# Patient Record
Sex: Male | Born: 1965 | Race: White | Hispanic: No | Marital: Married | Smoking: Never smoker
Health system: Southern US, Community
[De-identification: ages and names within clinical notes are randomized; demographics above are authoritative.]

---

## 2003-08-05 ENCOUNTER — Inpatient Hospital Stay (HOSPITAL_COMMUNITY): Admission: EM | Admit: 2003-08-05 | Discharge: 2003-08-09 | Payer: Self-pay | Admitting: Emergency Medicine

## 2006-03-27 IMAGING — CT CT PELVIS W/ CM
1 of 3 series · 14 of 32 positions shown, 19 images · IV contrast (CONTRAST)
Comparison: none

CLINICAL DATA: Left lower abdominal pain. 
 CT ABDOMEN WITH CONTRAST AND PELVIS WITH CONTRAST 
 Contrast:  100 cc Omnipaque 300 IV as well as oral contrast. 
 CT ABDOMEN WITH CONTRAST 
 Visualized lung bases show bibasilar atelectasis.  The enhanced appearance of the liver, spleen, gallbladder, pancreas, adrenal glands and kidneys are within normal limits.  Abdominal bowel loops show no obstruction, inflammatory process, or focal thickening.  No abscess or free fluid is seen in the abdomen. 
 IMPRESSION
 Normal CT of the abdomen with contrast. 
 CT PELVIS WITH CONTRAST
 There is severe diverticular disease of the sigmoid colon with a diffuse segment of inflamed and thickened colon present including small adjacent fluid collections likely representing small pericolonic abscesses.  No overt perforation.  Findings are consistent with diverticulitis. 
 Severe diverticulitis involving a segment of the sigmoid colon with small developing fluid collections likely representing multiple small pericolonic abscesses.  No evidence of perforation or associated bowel obstruction.

[Series 8151: — · axial · 0.75mm/px · z∈[+1504,+1954]mm · 14 of 102 slices shown, 19 images]
[im 6/102  soft-tissue]
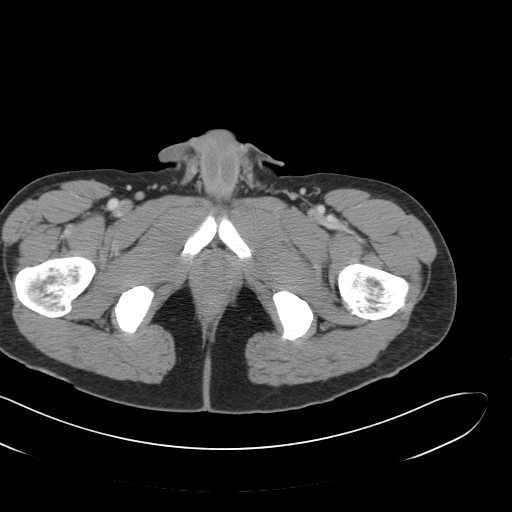
[im 6/102  bone]
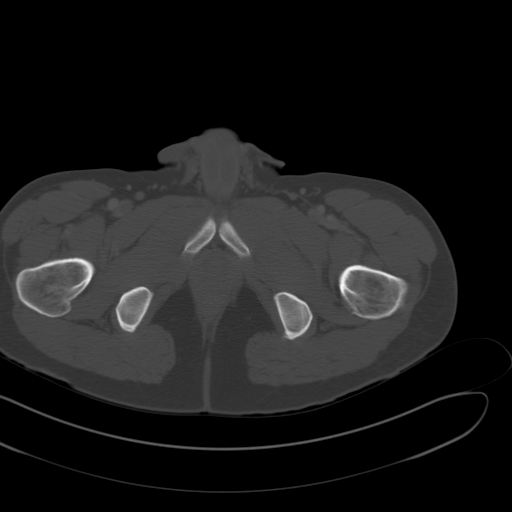
[im 12/102  soft-tissue]
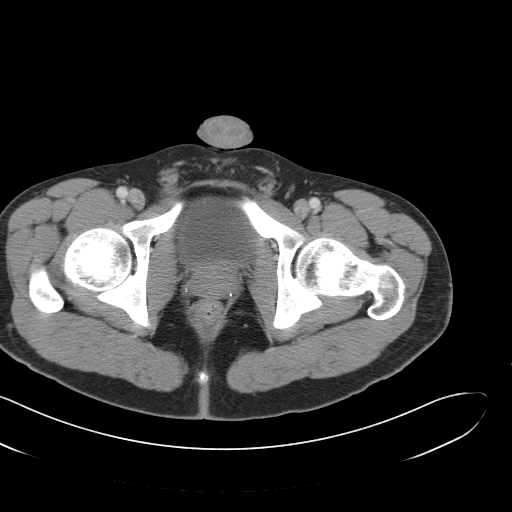
[im 23/102  soft-tissue]
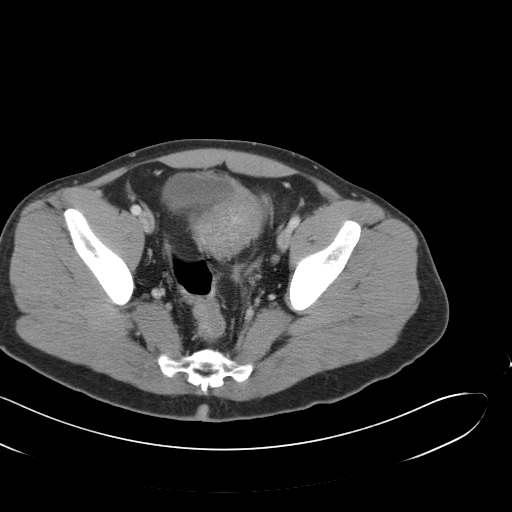
[im 29/102  soft-tissue]
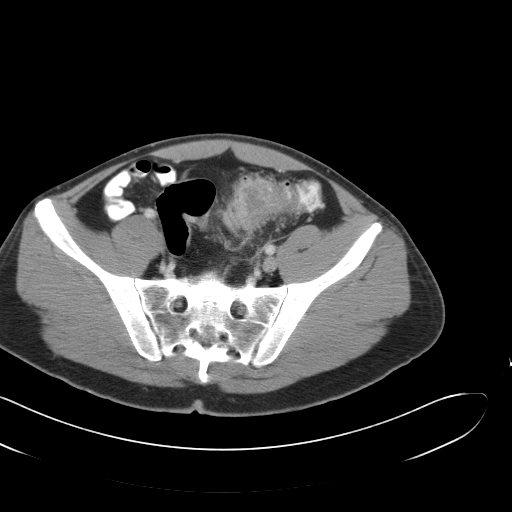
[im 34/102  soft-tissue]
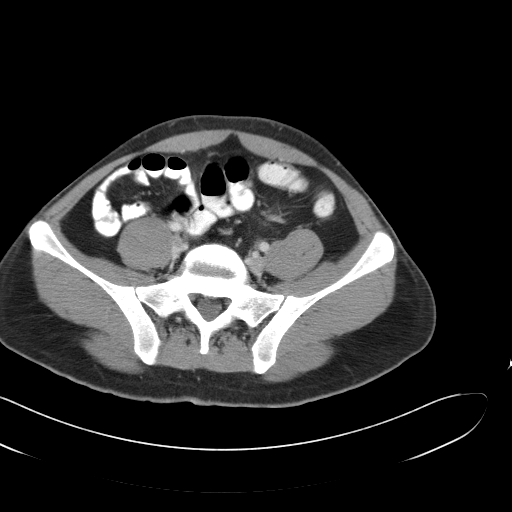
[im 45/102  soft-tissue]
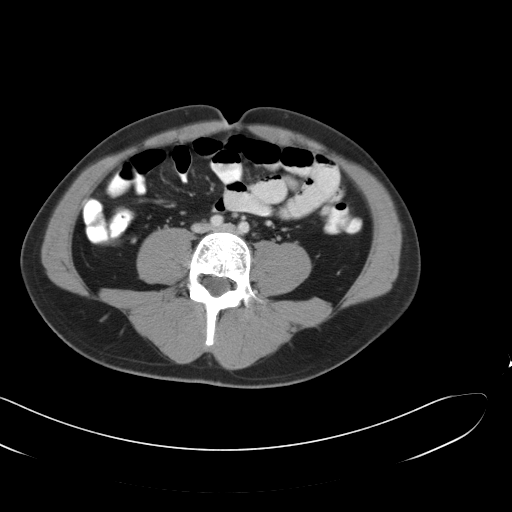
[im 51/102  soft-tissue]
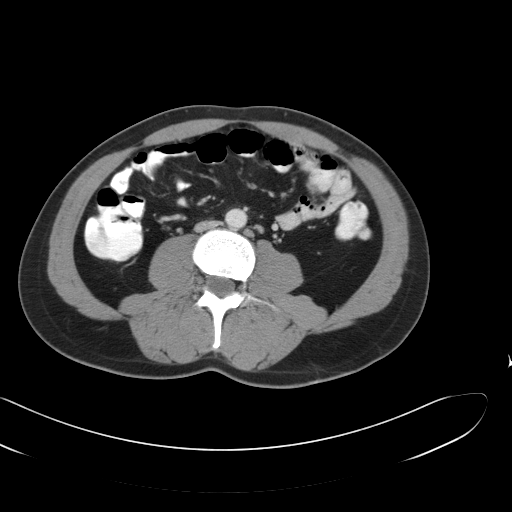
[im 57/102  soft-tissue]
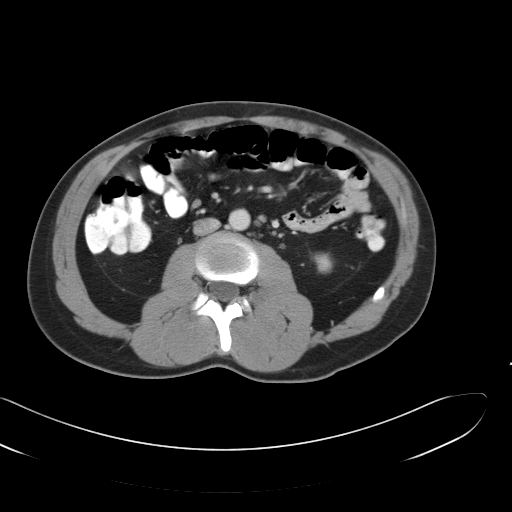
[im 68/102  soft-tissue]
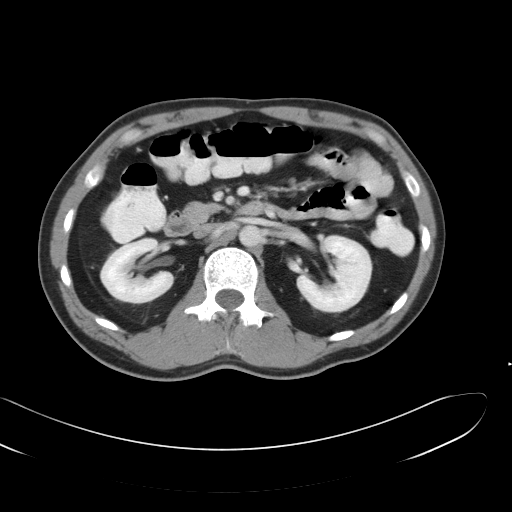
[im 68/102  bone]
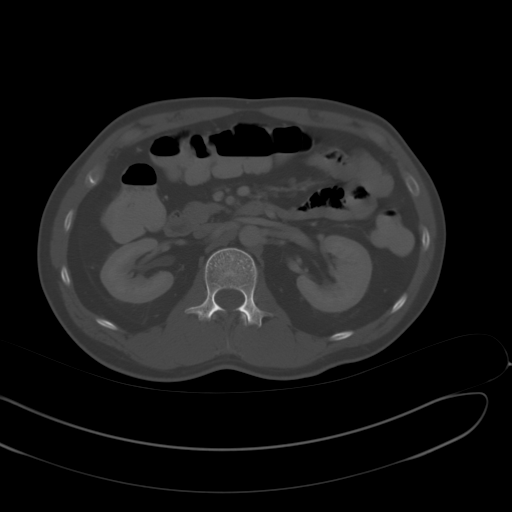
[im 73/102  soft-tissue]
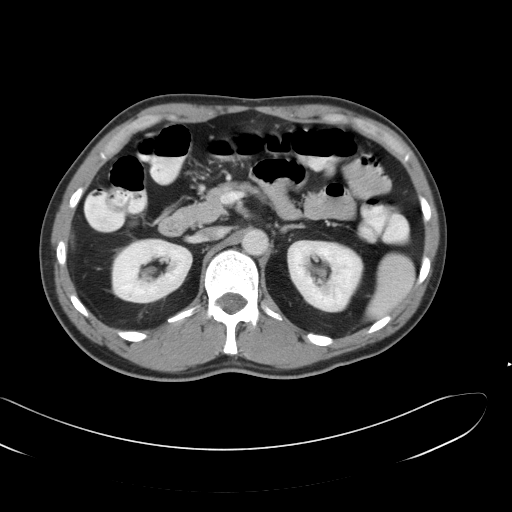
[im 79/102  soft-tissue]
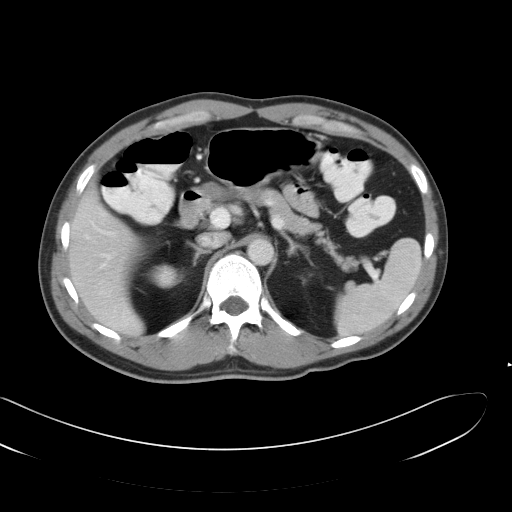
[im 79/102  lung]
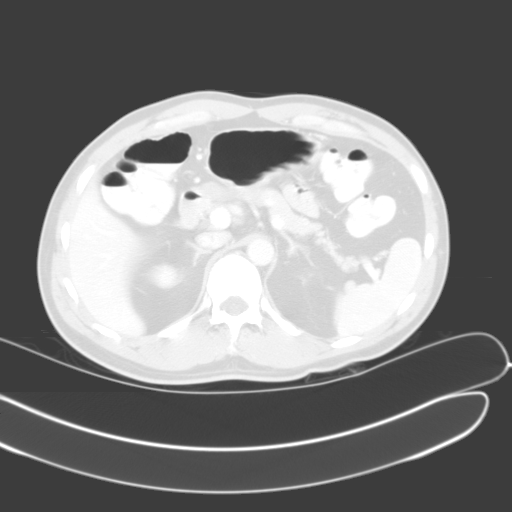
[im 85/102  lung]
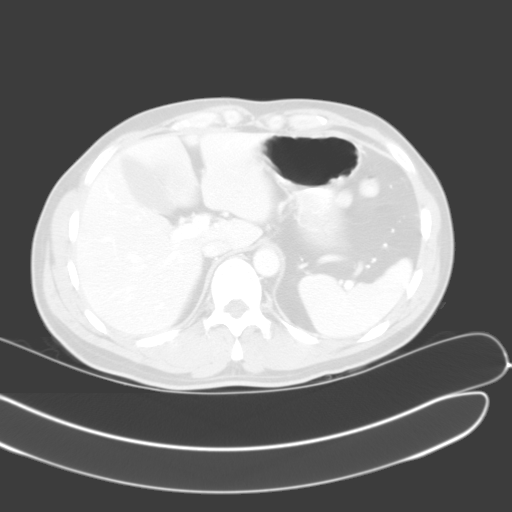
[im 90/102  soft-tissue]
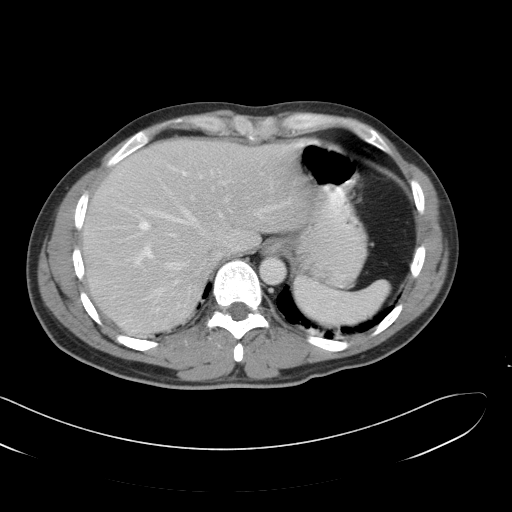
[im 90/102  lung]
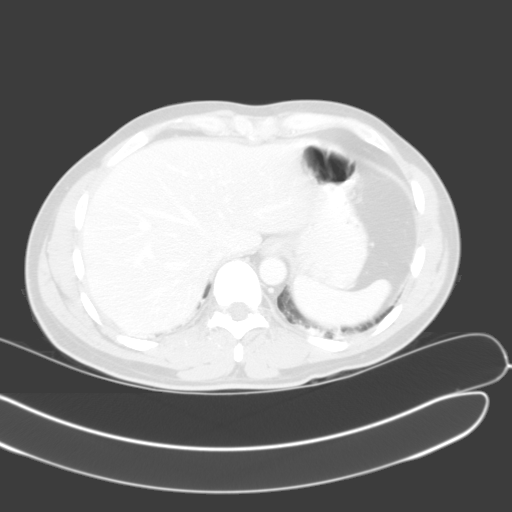
[im 96/102  soft-tissue]
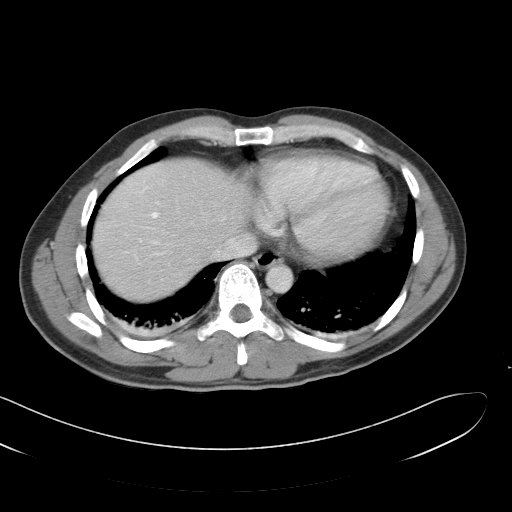
[im 96/102  lung]
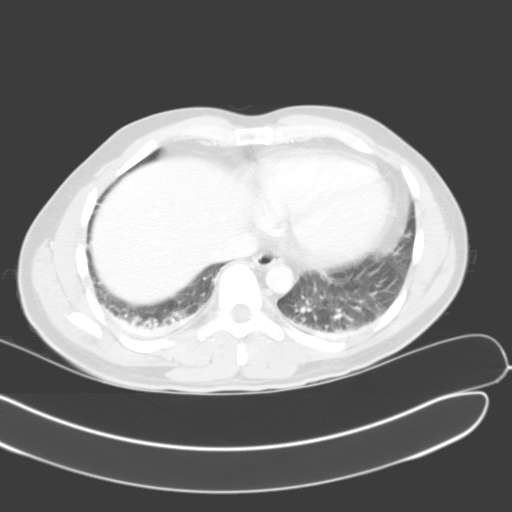

[14 of 32 positions shown; findings below may reference images not displayed]

## 2010-09-16 ENCOUNTER — Emergency Department (HOSPITAL_COMMUNITY)
Admission: EM | Admit: 2010-09-16 | Discharge: 2010-09-16 | Disposition: A | Discharge status: Home / Self Care | Payer: BC Managed Care – PPO | Attending: Emergency Medicine | Admitting: Emergency Medicine

## 2010-09-16 DIAGNOSIS — S058X9A Other injuries of unspecified eye and orbit, initial encounter: Secondary | ICD-10-CM | POA: Insufficient documentation

## 2010-09-16 DIAGNOSIS — Y9269 Other specified industrial and construction area as the place of occurrence of the external cause: Secondary | ICD-10-CM | POA: Insufficient documentation

## 2010-09-16 DIAGNOSIS — H5710 Ocular pain, unspecified eye: Secondary | ICD-10-CM | POA: Insufficient documentation

## 2010-09-16 DIAGNOSIS — S0501XA Injury of conjunctiva and corneal abrasion without foreign body, right eye, initial encounter: Secondary | ICD-10-CM

## 2010-09-16 DIAGNOSIS — X58XXXA Exposure to other specified factors, initial encounter: Secondary | ICD-10-CM | POA: Insufficient documentation

## 2010-09-16 MED ORDER — OXYCODONE-ACETAMINOPHEN 5-325 MG PO TABS: 1.0000 | ORAL_TABLET | ORAL | Status: AC | PRN

## 2010-09-16 MED ORDER — TETRACAINE HCL 0.5 % OP SOLN
OPHTHALMIC | Status: AC
  Filled 2010-09-16: qty 2

## 2010-09-16 MED ORDER — TOBRAMYCIN 0.3 % OP SOLN
1.0000 [drp] | Freq: Once | OPHTHALMIC | Status: AC
  Administered 2010-09-16: 1 [drp] via OPHTHALMIC
  Filled 2010-09-16: qty 5

## 2010-09-16 NOTE — ED Notes (Signed)
Pt reports an unknown object hitting his rt eye today while he was working.  Pt states that he felt when it hit, but wasn't sure what it was.  Eye is red and watering.
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
## 2011-02-06 ENCOUNTER — Encounter (HOSPITAL_COMMUNITY): Payer: Self-pay

## 2011-02-06 ENCOUNTER — Emergency Department (HOSPITAL_COMMUNITY)
Admission: EM | Admit: 2011-02-06 | Discharge: 2011-02-06 | Disposition: A | Discharge status: Home / Self Care | Payer: Self-pay | Attending: Emergency Medicine | Admitting: Emergency Medicine

## 2011-02-06 DIAGNOSIS — R51 Headache: Secondary | ICD-10-CM | POA: Insufficient documentation

## 2011-02-06 DIAGNOSIS — R220 Localized swelling, mass and lump, head: Secondary | ICD-10-CM | POA: Insufficient documentation

## 2011-02-06 DIAGNOSIS — J3489 Other specified disorders of nose and nasal sinuses: Secondary | ICD-10-CM | POA: Insufficient documentation

## 2011-02-06 DIAGNOSIS — J329 Chronic sinusitis, unspecified: Secondary | ICD-10-CM | POA: Insufficient documentation

## 2011-02-06 DIAGNOSIS — R5381 Other malaise: Secondary | ICD-10-CM | POA: Insufficient documentation

## 2011-02-06 DIAGNOSIS — R509 Fever, unspecified: Secondary | ICD-10-CM | POA: Insufficient documentation

## 2011-02-06 MED ORDER — ONDANSETRON HCL 4 MG PO TABS
4.0000 mg | ORAL_TABLET | Freq: Four times a day (QID) | ORAL | Status: AC
Start: 2011-02-06 — End: 2011-02-13

## 2011-02-06 MED ORDER — AZITHROMYCIN 250 MG PO TABS: ORAL_TABLET | ORAL | Status: DC

## 2011-02-06 NOTE — ED Provider Notes (Signed)
History     CSN: [id number]  Arrival date & time 02/06/11  0945   First MD Initiated Contact with Patient 02/06/11 1050      Chief Complaint  Patient presents with  . Nasal Congestion    (Consider location/radiation/quality/duration/timing/severity/associated sxs/prior treatment) HPI Comments: Patient reports having fever and flu=like symptoms 5 days ago and took tamiflu for two days.  Coes to ED today c/o sinus pressure and facial pain.  States he has been blowing yellow to green , thick mucous form his nose.  He also c/o occasional cough but denies neck pain or stiffness, fever now or vomiting  Patient is a 46 y.o. male presenting with URI. The history is provided by the patient.  URI The primary symptoms include headaches and cough. Primary symptoms do not include fever, sore throat, swollen glands, wheezing, abdominal pain, nausea, vomiting, myalgias, arthralgias or rash. Primary symptoms comment: nasal congestion, sinus pressure  The headache is not associated with weakness.  The cough began 3 to 5 days ago. The cough is new. The cough is productive.  Symptoms associated with the illness include facial pain, sinus pressure, congestion and rhinorrhea. The illness is not associated with chills or plugged ear sensation.    History reviewed. No pertinent past medical history.  History reviewed. No pertinent past surgical history.  History reviewed. No pertinent family history.  History  Substance Use Topics  . Smoking status: Never Smoker   . Smokeless tobacco: Not on file  . Alcohol Use: No      Review of Systems  Constitutional: Negative for fever, chills, activity change and appetite change.  HENT: Positive for congestion, rhinorrhea and sinus pressure. Negative for sore throat and dental problem.   Eyes: Negative for visual disturbance.  Respiratory: Positive for cough. Negative for wheezing.   Gastrointestinal: Negative for nausea, vomiting and abdominal pain.    Musculoskeletal: Negative for myalgias and arthralgias.  Skin: Negative for rash.  Neurological: Positive for headaches. Negative for dizziness, facial asymmetry and weakness.  All other systems reviewed and are negative.    Allergies  Review of patient's allergies indicates no known allergies.  Home Medications   Current Outpatient Rx  Name Route Sig Dispense Refill  . ACETAMINOPHEN 500 MG PO TABS Oral Take 1,000 mg by mouth every 6 (six) hours as needed. pain       BP 130/80  Pulse 67  Temp 98.6 F (37 C)  Resp 16  Ht 5\' 8"  (1.727 m)  Wt 170 lb (77.111 kg)  BMI 25.85 kg/m2  SpO2 100%  Physical Exam  Nursing note and vitals reviewed. Constitutional: He is oriented to person, place, and time. He appears well-developed and well-nourished. No distress.  HENT:  Head: Normocephalic and atraumatic.  Right Ear: Tympanic membrane and ear canal normal.  Left Ear: Tympanic membrane and ear canal normal.  Nose: Mucosal edema present. Right sinus exhibits no maxillary sinus tenderness and no frontal sinus tenderness. Left sinus exhibits no maxillary sinus tenderness and no frontal sinus tenderness.  Mouth/Throat: Oropharynx is clear and moist and mucous membranes are normal.  Eyes: Conjunctivae and EOM are normal. Pupils are equal, round, and reactive to light.  Neck: Normal range of motion. Neck supple.  Cardiovascular: Normal rate, regular rhythm and normal heart sounds.   No murmur heard. Pulmonary/Chest: Effort normal and breath sounds normal. No respiratory distress. He has no wheezes. He has no rales.  Lymphadenopathy:    He has no cervical adenopathy.  Neurological: He is  alert and oriented to person, place, and time. He exhibits normal muscle tone. Coordination normal.  Skin: Skin is warm and dry.    ED Course  Procedures (including critical care time)       MDM   Patient is alert, NAD.  Vitals stable.  Abd is soft, NT.  Non-toxic appearing.  Likely  sinusitis       Jewelz Ricklefs L. Zooey Schreurs, [place] 02/08/11 1326

## 2011-02-06 NOTE — ED Notes (Signed)
Pt reports being sick on Thursday with fever and received Tamiflu. Pt here today with c/o congestion and cough. nad noted. resp even/nonlabored.

## 2011-02-09 NOTE — ED Provider Notes (Signed)
Medical screening examination/treatment/procedure(s) were performed by non-physician practitioner and as supervising physician I was immediately available for consultation/collaboration.  Praveen Coia, MD 02/09/11 1531 

## 2013-08-19 ENCOUNTER — Emergency Department (INDEPENDENT_AMBULATORY_CARE_PROVIDER_SITE_OTHER)
Admission: EM | Admit: 2013-08-19 | Discharge: 2013-08-19 | Disposition: A | Discharge status: Home / Self Care | Payer: Worker's Compensation | Source: Home / Self Care | Attending: Family Medicine | Admitting: Family Medicine

## 2013-08-19 ENCOUNTER — Encounter (HOSPITAL_COMMUNITY): Payer: Self-pay | Admitting: Emergency Medicine

## 2013-08-19 DIAGNOSIS — T1490XA Injury, unspecified, initial encounter: Secondary | ICD-10-CM

## 2013-08-19 DIAGNOSIS — S1093XA Contusion of unspecified part of neck, initial encounter: Secondary | ICD-10-CM

## 2013-08-19 DIAGNOSIS — S0083XA Contusion of other part of head, initial encounter: Secondary | ICD-10-CM

## 2013-08-19 DIAGNOSIS — Y990 Civilian activity done for income or pay: Secondary | ICD-10-CM

## 2013-08-19 DIAGNOSIS — S0003XA Contusion of scalp, initial encounter: Secondary | ICD-10-CM

## 2013-08-19 NOTE — Discharge Instructions (Signed)
Thank you for coming in today. Take advil or aleve or tylenol for pain as needed.  Follow up with primary doctor.  Go to the emergency room if your headache becomes excruciating or you have weakness or numbness or uncontrolled vomiting.    Contusion A contusion is a deep bruise. Contusions are the result of an injury that caused bleeding under the skin. The contusion may turn blue, purple, or yellow. Minor injuries will give you a painless contusion, but more severe contusions may stay painful and swollen for a few weeks.  CAUSES  A contusion is usually caused by a blow, trauma, or direct force to an area of the body. SYMPTOMS   Swelling and redness of the injured area.  Bruising of the injured area.  Tenderness and soreness of the injured area.  Pain. DIAGNOSIS  The diagnosis can be made by taking a history and physical exam. An X-ray, CT scan, or MRI may be needed to determine if there were any associated injuries, such as fractures. TREATMENT  Specific treatment will depend on what area of the body was injured. In general, the best treatment for a contusion is resting, icing, elevating, and applying cold compresses to the injured area. Over-the-counter medicines may also be recommended for pain control. Ask your caregiver what the best treatment is for your contusion. HOME CARE INSTRUCTIONS   Put ice on the injured area.  Put ice in a plastic bag.  Place a towel between your skin and the bag.  Leave the ice on for 15-20 minutes, 3-4 times a day, or as directed by your health care provider.  Only take over-the-counter or prescription medicines for pain, discomfort, or fever as directed by your caregiver. Your caregiver may recommend avoiding anti-inflammatory medicines (aspirin, ibuprofen, and naproxen) for 48 hours because these medicines may increase bruising.  Rest the injured area.  If possible, elevate the injured area to reduce swelling. SEEK IMMEDIATE MEDICAL CARE IF:     You have increased bruising or swelling.  You have pain that is getting worse.  Your swelling or pain is not relieved with medicines. MAKE SURE YOU:   Understand these instructions.  Will watch your condition.  Will get help right away if you are not doing well or get worse. Document Released: 11/01/2004 Document Revised: 01/27/2013 Document Reviewed: 11/27/2010 [facility] Patient Information 2015 [place], [place]. This information is not intended to replace advice given to you by your health care provider. Make sure you discuss any questions you have with your health care provider.
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
## 2017-06-12 ENCOUNTER — Emergency Department (HOSPITAL_COMMUNITY)
Admission: EM | Admit: 2017-06-12 | Discharge: 2017-06-12 | Disposition: A | Discharge status: Home / Self Care | Payer: PRIVATE HEALTH INSURANCE | Attending: Emergency Medicine | Admitting: Emergency Medicine

## 2017-06-12 ENCOUNTER — Encounter (HOSPITAL_COMMUNITY): Payer: Self-pay | Admitting: Emergency Medicine

## 2017-06-12 ENCOUNTER — Other Ambulatory Visit: Payer: Self-pay

## 2017-06-12 DIAGNOSIS — R509 Fever, unspecified: Secondary | ICD-10-CM | POA: Diagnosis present

## 2017-06-12 DIAGNOSIS — J069 Acute upper respiratory infection, unspecified: Secondary | ICD-10-CM | POA: Diagnosis not present

## 2017-06-12 MED ORDER — AZITHROMYCIN 250 MG PO TABS: ORAL_TABLET | ORAL | 0 refills | Status: AC

## 2017-06-12 NOTE — ED Notes (Signed)
Pt seen Dr. Margo Aye on Monday for same symptoms- sinus HA (pointing to forehead) and congestion.  Fevers at home, 1720 today fever 101.7 per pt and took ibuprofen at that time.

## 2017-06-12 NOTE — ED Provider Notes (Signed)
[facility] EMERGENCY DEPARTMENT Provider Note   CSN: [id number] Arrival date & time: 06/12/17  1836     History   Chief Complaint Chief Complaint  Patient presents with  . Fever    HPI Gregory Gould is a 52 y.o. male who presents to the ED with fever and headache x 5 days. Patient saw his PCP on Monday and dx with URI. Patient reports symptoms have gotten worse and now he is starting to cough. Patient has been taking Zyrtec and ibuprofen.   HPI  History reviewed. No pertinent past medical history.  There are no active problems to display for this patient.   History reviewed. No pertinent surgical history.      Home Medications    Prior to Admission medications   Medication Sig Start Date End Date Taking? Authorizing Provider  azithromycin (ZITHROMAX Z-PAK) 250 MG tablet Take 2 tablets PO now and then 1 tablet daily 06/12/17   Janne Napoleon, NP    Family History No family history on file.  Social History Social History   Tobacco Use  . Smoking status: Never Smoker  . Smokeless tobacco: Never Used  Substance Use Topics  . Alcohol use: No  . Drug use: No     Allergies   Patient has no known allergies.   Review of Systems Review of Systems  Constitutional: Positive for fever.  HENT: Positive for congestion.   Eyes: Negative for pain, discharge, redness and itching.  Respiratory: Positive for cough.   Cardiovascular: Negative for chest pain.  Gastrointestinal: Positive for nausea (earlier in the week, resolved). Negative for abdominal pain.  Musculoskeletal: Positive for myalgias.  Skin: Negative for rash.  Neurological: Positive for headaches.  Psychiatric/Behavioral: Negative for confusion.     Physical Exam Updated Vital Signs BP 133/83 (BP Location: Right Arm)   Pulse 96   Temp 99.4 F (37.4 C) (Oral)   Resp 18   Ht  (1.702 m)   Wt 75.3 kg (166 lb)   SpO2 97%   BMI 26.00 kg/m   Physical Exam  Constitutional: He is oriented to  person, place, and time. He appears well-developed and well-nourished. No distress.  HENT:  Head: Normocephalic.  Right Ear: Tympanic membrane normal.  Left Ear: Tympanic membrane normal.  Nose: Mucosal edema and rhinorrhea present. Right sinus exhibits maxillary sinus tenderness. Left sinus exhibits maxillary sinus tenderness.  Mouth/Throat: Oropharynx is clear and moist.  Eyes: Pupils are equal, round, and reactive to light. Conjunctivae and EOM are normal.  Neck: Normal range of motion. Neck supple.  Cardiovascular: Normal rate and regular rhythm.  Pulmonary/Chest: Effort normal and breath sounds normal.  Abdominal: Soft. There is no tenderness.  Musculoskeletal: Normal range of motion.  Lymphadenopathy:    He has no cervical adenopathy.  Neurological: He is alert and oriented to person, place, and time. No cranial nerve deficit.  Skin: Skin is warm and dry.  Psychiatric: He has a normal mood and affect.  Nursing note and vitals reviewed.    ED Treatments / Results  Labs (all labs ordered are listed, but only abnormal results are displayed) Labs Reviewed - No data to display Radiology No results found.  Procedures Procedures (including critical care time)  Medications Ordered in ED Medications - No data to display   Initial Impression / Assessment and Plan / ED Course  I have reviewed the triage vital signs and the nursing notes. 52 y.o. male with URI symptoms that have worsened over the  past 5 days stable for d/c without fever at this time and does not appear toxic. Patient to continue Zyrtec and ibuprofen and will give Z-pak. Return precautions discussed.   Final Clinical Impressions(s) / ED Diagnoses   Final diagnoses:  URI, acute    ED Discharge Orders        Ordered    azithromycin (ZITHROMAX Z-PAK) 250 MG tablet     06/12/17 1929       Kerrie [place], [place] 06/12/17 1934    Benjiman Core, MD 06/12/17 2356

## 2017-06-12 NOTE — ED Triage Notes (Signed)
Pt c/o fever with headache since the weekend.Pt states he seen pcp Monday and diagnosed with viral infection.

## 2017-06-12 NOTE — Discharge Instructions (Addendum)
Continue your zyrtec and ibuprofen.

## 2017-06-12 NOTE — ED Notes (Signed)
Pt verbalized understanding of d/c instructions.  Unable to obtain signature due to another staff member in the chart and unable to pull up e-signature.

## 2018-11-14 ENCOUNTER — Other Ambulatory Visit: Payer: Self-pay

## 2018-11-14 DIAGNOSIS — Z20822 Contact with and (suspected) exposure to covid-19: Secondary | ICD-10-CM

## 2018-11-16 LAB — NOVEL CORONAVIRUS, NAA: SARS-CoV-2, NAA: DETECTED — AB

## 2020-11-21 ENCOUNTER — Other Ambulatory Visit (HOSPITAL_COMMUNITY): Payer: Self-pay

## 2020-11-21 MED ORDER — INFLUENZA VAC SPLIT QUAD 0.5 ML IM SUSY
PREFILLED_SYRINGE | INTRAMUSCULAR | 0 refills | Status: AC
  Filled 2020-11-21: qty 0.5, 1d supply, fill #0

## 2021-06-30 ENCOUNTER — Ambulatory Visit
Admission: EM | Admit: 2021-06-30 | Discharge: 2021-06-30 | Disposition: A | Discharge status: Home / Self Care | Payer: PRIVATE HEALTH INSURANCE | Attending: Nurse Practitioner | Admitting: Nurse Practitioner

## 2021-06-30 DIAGNOSIS — S90422A Blister (nonthermal), left great toe, initial encounter: Secondary | ICD-10-CM | POA: Diagnosis not present

## 2021-06-30 MED ORDER — MUPIROCIN 2 % EX OINT: 1.0000 "application " | TOPICAL_OINTMENT | Freq: Two times a day (BID) | CUTANEOUS | 0 refills | Status: AC

## 2021-06-30 MED ORDER — IBUPROFEN 800 MG PO TABS: 800.0000 mg | ORAL_TABLET | Freq: Three times a day (TID) | ORAL | 0 refills | Status: DC | PRN

## 2021-06-30 NOTE — ED Provider Notes (Signed)
[facility]    CSN: [id number] Arrival date & time: 06/30/21  0809      History   Chief Complaint Chief Complaint  Patient presents with   Abscess    HPI Gregory Gould is a 56 y.o. male.   The patient is a 56 year old male who presents with swelling to the left great toe.  Patient states symptoms started 2 days ago.  He denies any injury or trauma to the left foot/toe.  He states that he does wear steel toed shoes at work.  The area is tender to palpation.  Per patient, he has not had any fever, chills, abdominal pain, nausea, or vomiting.  He has not taken any medication for his symptoms.  The history is provided by the patient and the spouse.   History reviewed. No pertinent past medical history.  There are no problems to display for this patient.   History reviewed. No pertinent surgical history.     Home Medications    Prior to Admission medications   Medication Sig Start Date End Date Taking? Authorizing Provider  ibuprofen (ADVIL) 800 MG tablet Take 1 tablet (800 mg total) by mouth every 8 (eight) hours as needed for moderate pain. 06/30/21  Yes Shubham Thackston-Warren, Sadie Haber, NP  mupirocin ointment (BACTROBAN) 2 % Apply 1 application. topically 2 (two) times daily. 06/30/21  Yes Kenya Shiraishi-Warren, Sadie Haber, NP  azithromycin (ZITHROMAX Z-PAK) 250 MG tablet Take 2 tablets PO now and then 1 tablet daily 06/12/17   Janne Napoleon, NP  influenza vac split quadrivalent PF (FLUARIX) 0.5 ML injection Inject into the muscle. 11/21/20   Judyann Munson, MD    Family History History reviewed. No pertinent family history.  Social History Social History   Tobacco Use   Smoking status: Never   Smokeless tobacco: Never  Substance Use Topics   Alcohol use: No   Drug use: No     Allergies   Patient has no known allergies.   Review of Systems Review of Systems PER HPI  Physical Exam Triage Vital Signs ED Triage Vitals  Enc Vitals Group     BP 06/30/21 0838  (!) 167/85     Pulse Rate 06/30/21 0838 82     Resp 06/30/21 0838 20     Temp 06/30/21 0838 98.1 F (36.7 C)     Temp src --      SpO2 06/30/21 0838 97 %     Weight --      Height --      Head Circumference --      Peak Flow --      Pain Score 06/30/21 0837 7     Pain Loc --      Pain Edu? --      Excl. in GC? --    No data found.  Updated Vital Signs BP (!) 167/85   Pulse 82   Temp 98.1 F (36.7 C)   Resp 20   SpO2 97%   Visual Acuity Right Eye Distance:   Left Eye Distance:   Bilateral Distance:    Right Eye Near:   Left Eye Near:    Bilateral Near:     Physical Exam Vitals and nursing note reviewed.  Constitutional:      Appearance: Normal appearance.  Cardiovascular:     Rate and Rhythm: Normal rate and regular rhythm.     Pulses: Normal pulses.     Heart sounds: Normal heart sounds.  Pulmonary:  Effort: Pulmonary effort is normal.     Breath sounds: Normal breath sounds.  Abdominal:     General: Bowel sounds are normal.     Palpations: Abdomen is soft.  Musculoskeletal:       Feet:  Feet:     Left foot:     Skin integrity: Blister present.     Comments: Fluid-filled blister to the left great toe that extends to the lateral aspect of the left great toe.  Area is tender to palpation.  No fluctuance is present, no bruising, ecchymosis, or obvious deformity is noted. Skin:    General: Skin is warm and dry.  Neurological:     General: No focal deficit present.     Mental Status: He is alert and oriented to person, place, and time.  Psychiatric:        Mood and Affect: Mood normal.        Behavior: Behavior normal.     UC Treatments / Results  Labs (all labs ordered are listed, but only abnormal results are displayed) Labs Reviewed - No data to display  EKG   Radiology No results found.  Procedures Procedures (including critical care time)  Medications Ordered in UC Medications - No data to display  Initial Impression / Assessment  and Plan / UC Course  I have reviewed the triage vital signs and the nursing notes.  Pertinent labs & imaging results that were available during my care of the patient were reviewed by me and considered in my medical decision making (see chart for details).  Patient is a 56 year old male who presents with a fluid-filled blister to the left great toe.  The blister is under the nailbed that extends to the lateral aspect of the left great toe.  His exam is reassuring, vital signs are stable.  No concern for abscess as area has obvious fluid present, no fluctuance is present.  Supportive care was advised.  Patient was given prescription for ibuprofen and mupirocin.  Strict return precautions were provided.  Follow-up as needed. Final Clinical Impressions(s) / UC Diagnoses   Final diagnoses:  Blister of left great toe, initial encounter     Discharge Instructions      Symptoms are consistent with a blister of the left great toe most likely caused by the steel toed boot. Keep the area protected when you are wearing shoes.  Continue wearing the postop shoe as needed for comfort or pain. RICE therapy, rest, ice, compression, and elevation.  Apply ice for 20 minutes, remove for 1 hour, then repeat. May perform Epsom salt soaks to see if this helps with swelling of the left great toe. Continue to monitor the area for any worsening.  If symptoms worsen or do not improve, please follow-up in our clinic.     ED Prescriptions     Medication Sig Dispense Auth. Provider   mupirocin ointment (BACTROBAN) 2 % Apply 1 application. topically 2 (two) times daily. 22 g Tangier Shellhammer-Warren, Sadie Haber, NP   ibuprofen (ADVIL) 800 MG tablet Take 1 tablet (800 mg total) by mouth every 8 (eight) hours as needed for moderate pain. 20 tablet Mishon Blubaugh-Warren, Sadie Haber, NP      PDMP not reviewed this encounter.   Abran Cantor, NP 06/30/21 [phone]

## 2021-06-30 NOTE — Discharge Instructions (Addendum)
Symptoms are consistent with a blister of the left great toe most likely caused by the steel toed boot. Keep the area protected when you are wearing shoes.  Continue wearing the postop shoe as needed for comfort or pain. RICE therapy, rest, ice, compression, and elevation.  Apply ice for 20 minutes, remove for 1 hour, then repeat. May perform Epsom salt soaks to see if this helps with swelling of the left great toe. Continue to monitor the area for any worsening.  If symptoms worsen or do not improve, please follow-up in our clinic.

## 2021-06-30 NOTE — ED Triage Notes (Signed)
Pt presents with red swollen area on left great toe for past couple of days

## 2021-07-03 ENCOUNTER — Ambulatory Visit (INDEPENDENT_AMBULATORY_CARE_PROVIDER_SITE_OTHER): Payer: PRIVATE HEALTH INSURANCE

## 2021-07-03 ENCOUNTER — Ambulatory Visit
Admission: EM | Admit: 2021-07-03 | Discharge: 2021-07-03 | Disposition: A | Discharge status: Home / Self Care | Payer: PRIVATE HEALTH INSURANCE | Attending: Family Medicine | Admitting: Family Medicine

## 2021-07-03 DIAGNOSIS — M79675 Pain in left toe(s): Secondary | ICD-10-CM | POA: Diagnosis not present

## 2021-07-03 MED ORDER — IBUPROFEN 800 MG PO TABS: 800.0000 mg | ORAL_TABLET | Freq: Three times a day (TID) | ORAL | 0 refills | Status: AC | PRN

## 2021-07-03 NOTE — ED Provider Notes (Signed)
[facility]    CSN: [id number] Arrival date & time: 07/03/21  1751      History   Chief Complaint Chief Complaint  Patient presents with   Foot Pain    Left foot pain    HPI Gregory Gould is a 56 y.o. male.   Presenting today following up on left great toe pain, swelling for which she was seen 3 days ago in this clinic.  It was suspected to be a blistered area so he was given mupirocin and ibuprofen which she states has not been helping.  He denies fever, chills, numbness, tingling, known injury to the area, drainage or other skin changes since last seen.  Some stiffness and decreased range of motion in the toe.  Has been wearing postop shoe to help avoid pressure on the toe as well.   History reviewed. No pertinent past medical history.  There are no problems to display for this patient.   History reviewed. No pertinent surgical history.     Home Medications    Prior to Admission medications   Medication Sig Start Date End Date Taking? Authorizing Provider  azithromycin (ZITHROMAX Z-PAK) 250 MG tablet Take 2 tablets PO now and then 1 tablet daily 06/12/17   Janne Napoleon, NP  ibuprofen (ADVIL) 800 MG tablet Take 1 tablet (800 mg total) by mouth every 8 (eight) hours as needed for moderate pain. 07/03/21   Particia Nearing, PA-C  influenza vac split quadrivalent PF (FLUARIX) 0.5 ML injection Inject into the muscle. 11/21/20   Judyann Munson, MD  mupirocin ointment (BACTROBAN) 2 % Apply 1 application. topically 2 (two) times daily. 06/30/21   Leath-Warren, Sadie Haber, NP    Family History No family history on file.  Social History Social History   Tobacco Use   Smoking status: Never   Smokeless tobacco: Never  Vaping Use   Vaping Use: Never used  Substance Use Topics   Alcohol use: No   Drug use: No     Allergies   Patient has no known allergies.   Review of Systems Review of Systems Per HPI  Physical Exam Triage Vital Signs ED  Triage Vitals  Enc Vitals Group     BP 07/03/21 0836 (!) 176/103     Pulse Rate 07/03/21 0836 74     Resp 07/03/21 0836 20     Temp 07/03/21 0836 98.4 F (36.9 C)     Temp Source 07/03/21 0836 Oral     SpO2 07/03/21 0836 98 %     Weight --      Height --      Head Circumference --      Peak Flow --      Pain Score 07/03/21 0838 8     Pain Loc --      Pain Edu? --      Excl. in GC? --    No data found.  Updated Vital Signs BP (!) 176/103 (BP Location: Right Arm)   Pulse 74   Temp 98.4 F (36.9 C) (Oral)   Resp 20   SpO2 98%   Visual Acuity Right Eye Distance:   Left Eye Distance:   Bilateral Distance:    Right Eye Near:   Left Eye Near:    Bilateral Near:     Physical Exam Vitals and nursing note reviewed.  Constitutional:      Appearance: Normal appearance.  HENT:     Head: Atraumatic.  Eyes:  Extraocular Movements: Extraocular movements intact.     Conjunctiva/sclera: Conjunctivae normal.  Cardiovascular:     Rate and Rhythm: Normal rate and regular rhythm.  Pulmonary:     Effort: Pulmonary effort is normal.     Breath sounds: Normal breath sounds.  Musculoskeletal:        General: Swelling and tenderness present. No deformity or signs of injury. Normal range of motion.     Cervical back: Normal range of motion and neck supple.  Skin:    General: Skin is warm.     Comments: Localized area of hyperpigmentation and edema dorsal great toe distally.  Tender to palpation in this area but not throughout the rest of the toe or into the foot.  Range of motion intact but painful per patient.  Neurological:     General: No focal deficit present.     Mental Status: He is oriented to person, place, and time.     Comments: Left foot neurovascularly intact  Psychiatric:        Mood and Affect: Mood normal.        Thought Content: Thought content normal.        Judgment: Judgment normal.     UC Treatments / Results  Labs (all labs ordered are listed, but  only abnormal results are displayed) Labs Reviewed - No data to display  EKG   Radiology DG Foot Complete Left  Result Date: 07/03/2021 CLINICAL DATA:  left great toe redness, swelling, pain with no known injury EXAM: LEFT FOOT - COMPLETE 3+ VIEW COMPARISON:  None FINDINGS: There is soft tissue swelling of the great toe. There is no frank bony destruction. No periostitis. There is no evidence of acute fracture. Alignment is normal. There is no significant arthritis. IMPRESSION: Great toe soft tissue swelling.  No acute osseous abnormality. Electronically Signed   By: Caprice Renshaw M.D.   On: 07/03/2021 09:09    Procedures Procedures (including critical care time)  Medications Ordered in UC Medications - No data to display  Initial Impression / Assessment and Plan / UC Course  I have reviewed the triage vital signs and the nursing notes.  Pertinent labs & imaging results that were available during my care of the patient were reviewed by me and considered in my medical decision making (see chart for details).     Left foot x-ray negative for acute bony abnormality or obvious underlying cause, suspect hematoma, possibly from friction from steel toed boots.  Does not appear to be an infection or gouty type issue.  Continue the ibuprofen, Epsom salt soaks, elevation, ice.  Work note extended.  Follow-up with podiatry if worsening or not resolving.  Final Clinical Impressions(s) / UC Diagnoses   Final diagnoses:  Great toe pain, left     Discharge Instructions      Follow up with [facility] if worsening or not resolving [address] [place], [place] [address].  Main: [phone].    ED Prescriptions     Medication Sig Dispense Auth. Provider   ibuprofen (ADVIL) 800 MG tablet Take 1 tablet (800 mg total) by mouth every 8 (eight) hours as needed for moderate pain. 20 tablet Particia Nearing, [place]      PDMP not reviewed this encounter.   Particia Nearing, [place] 07/03/21 1043

## 2021-07-03 NOTE — Discharge Instructions (Signed)
Follow up with [facility] if worsening or not resolving [address] [place], [place] [address].  Main: [phone].

## 2021-07-03 NOTE — ED Triage Notes (Signed)
Pt states he came in on Friday for left foot pain but it is not any better  Pt states he was given an antibiotic cream and some Tylenol 800mg  without any relief

## 2024-02-24 IMAGING — DX DG FOOT COMPLETE 3+V*L*
3 series · 3 of 3 positions shown · non-contrast
Comparison: None

CLINICAL DATA: left great toe redness, swelling, pain with no known
injury

EXAM:
LEFT FOOT - COMPLETE 3+ VIEW

[foot ap]
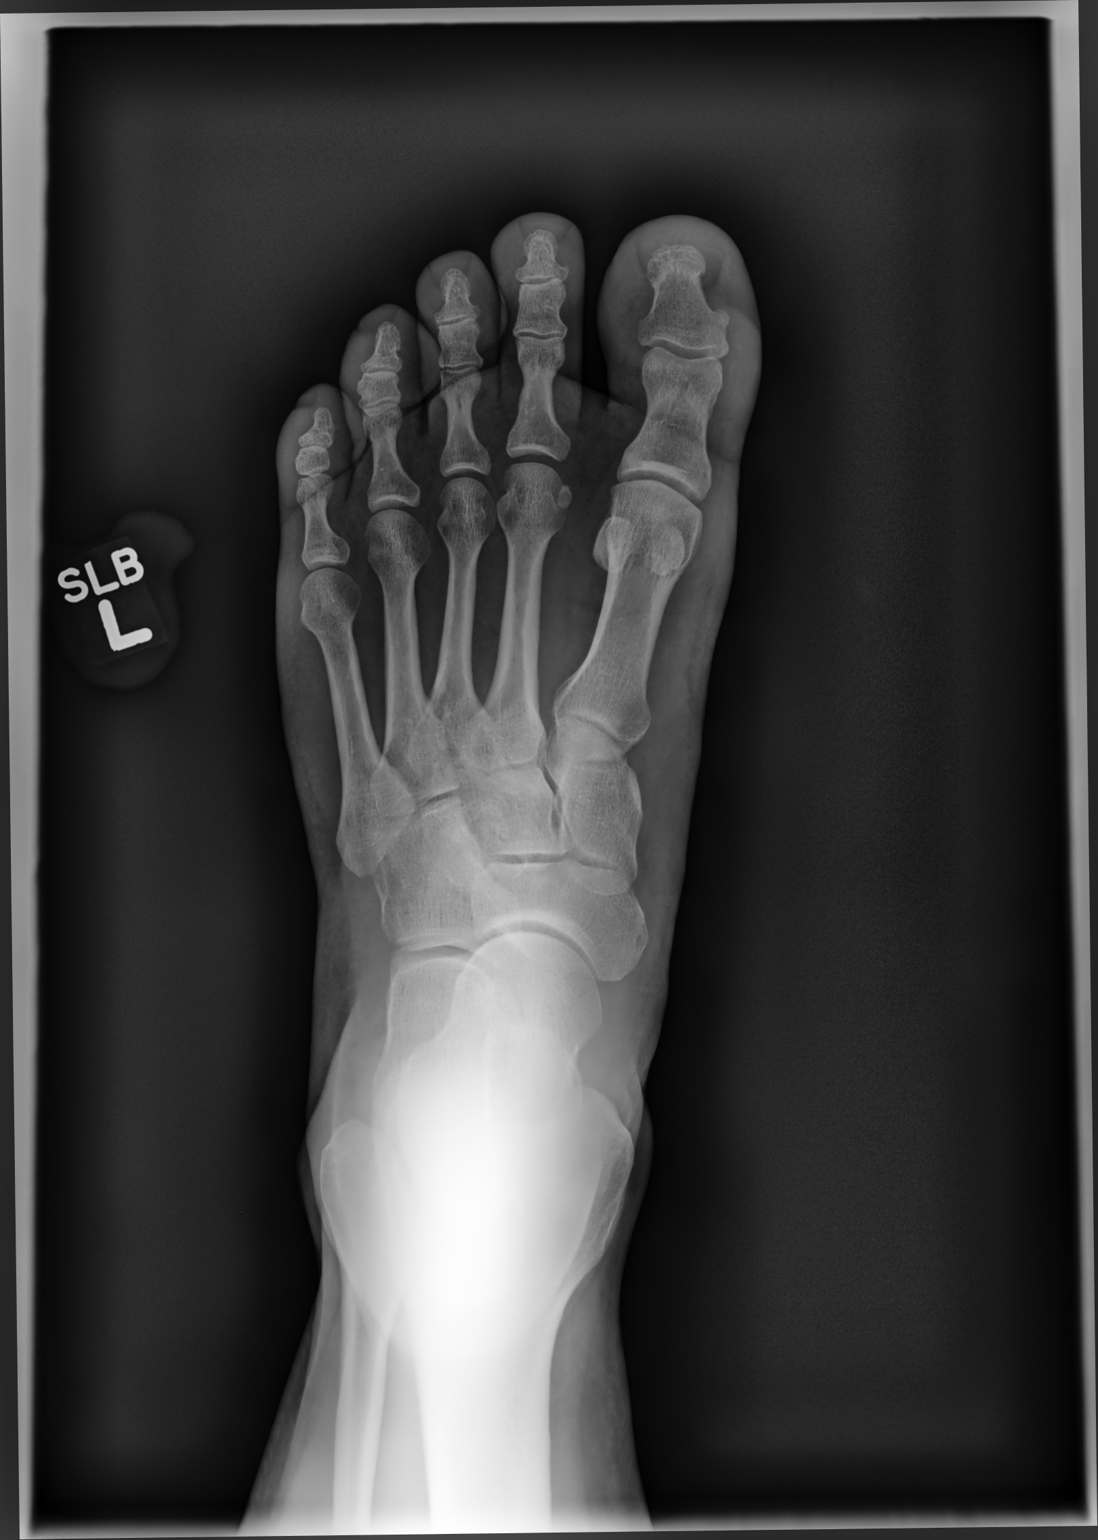

[foot mlo]
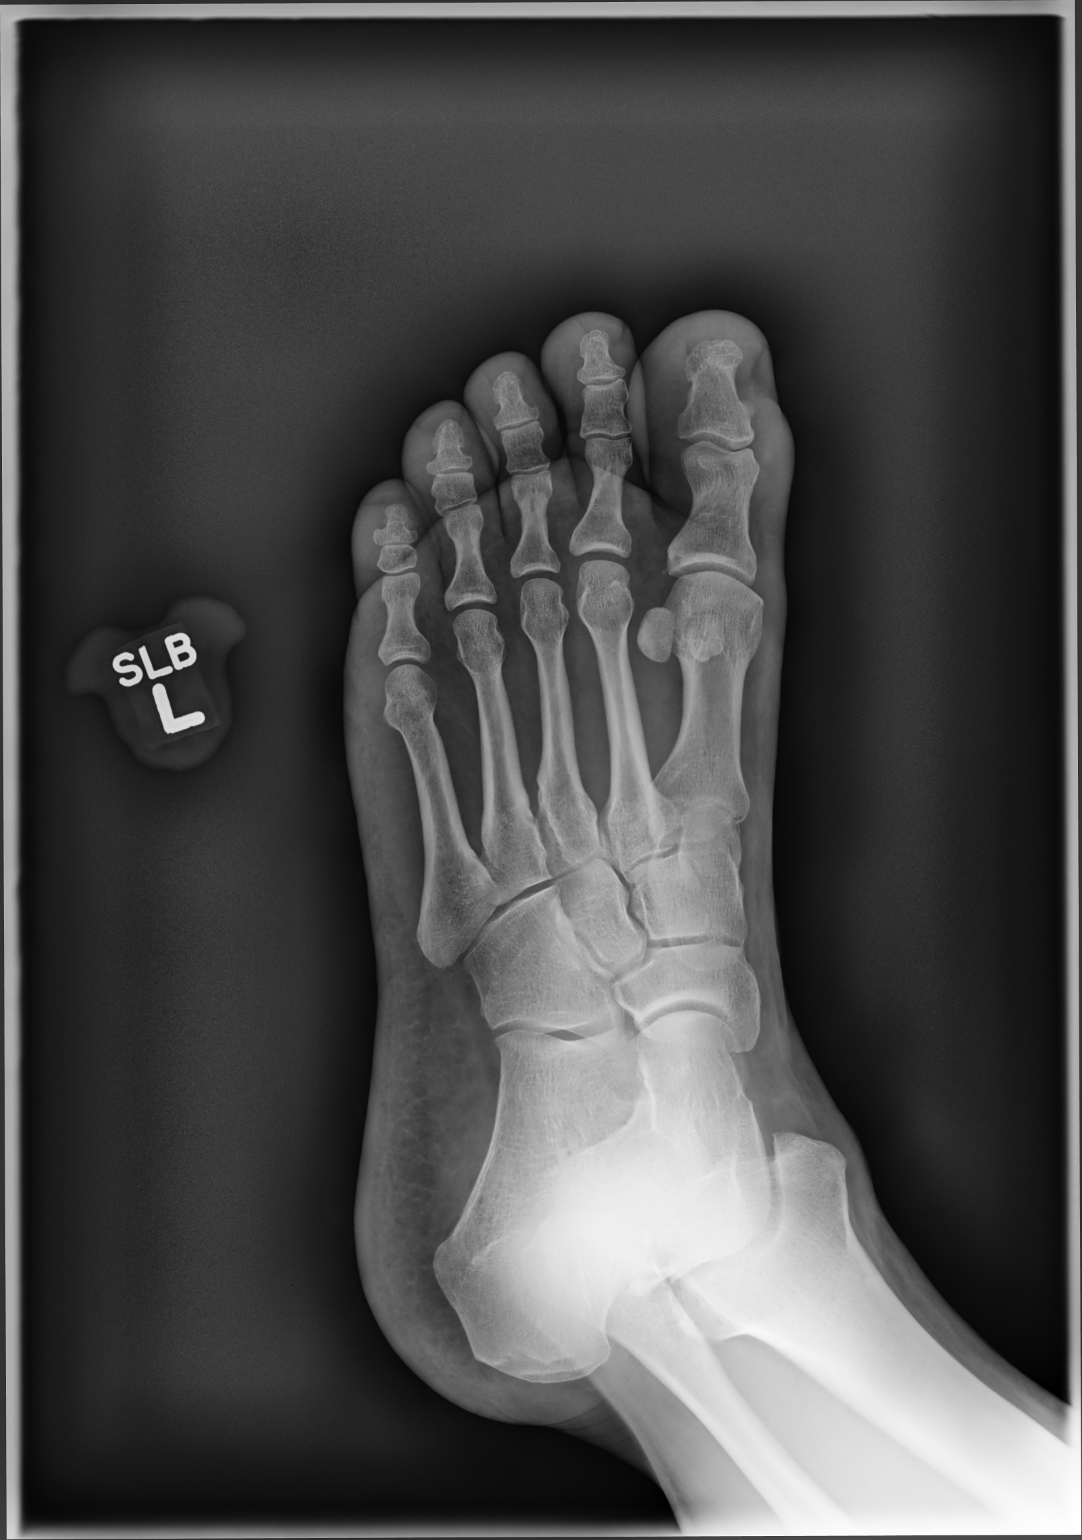

[foot lat]
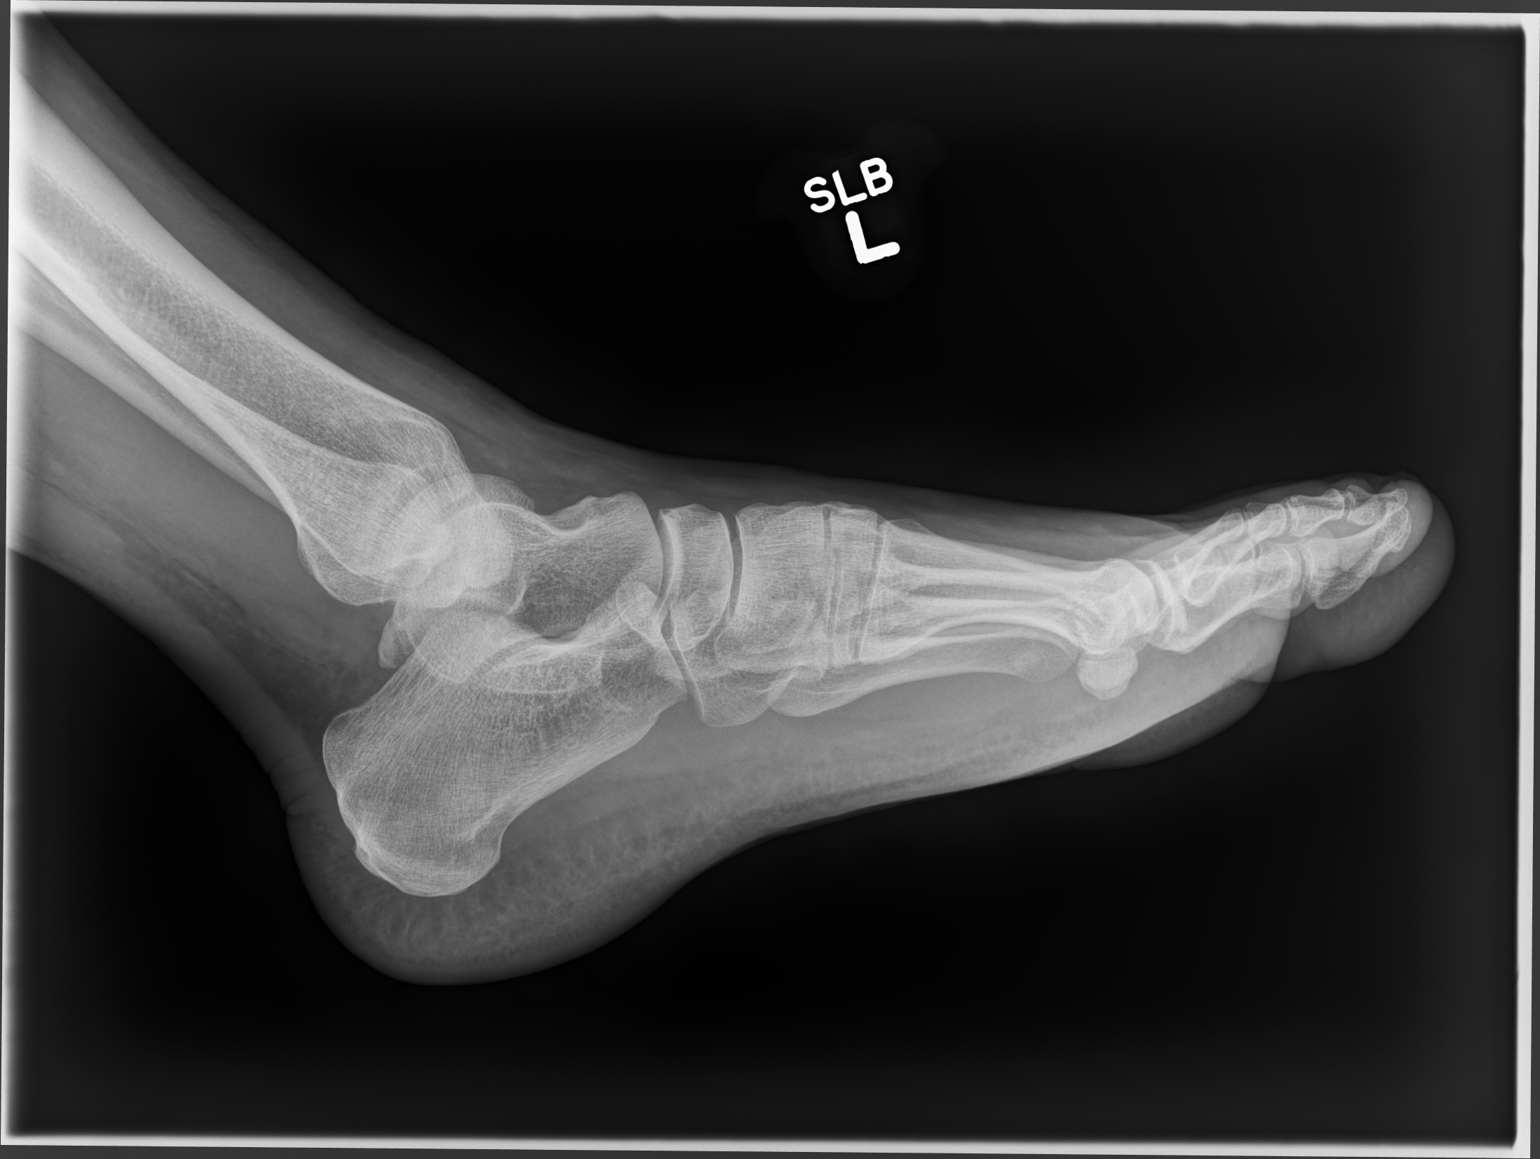

[3 of 3 positions shown; findings below may reference images not displayed]

FINDINGS: There is soft tissue swelling of the great toe. There is no frank
bony destruction. No periostitis. There is no evidence of acute
fracture. Alignment is normal. There is no significant arthritis.
IMPRESSION: Great toe soft tissue swelling.  No acute osseous abnormality.
